# Patient Record
Sex: Female | Born: 1975 | Race: White | Hispanic: No | Marital: Married | Smoking: Never smoker
Health system: Southern US, Community
[De-identification: ages and names within clinical notes are randomized; demographics above are authoritative.]

## PROBLEM LIST (undated history)

## (undated) DIAGNOSIS — O09529 Supervision of elderly multigravida, unspecified trimester: Secondary | ICD-10-CM

## (undated) DIAGNOSIS — C801 Malignant (primary) neoplasm, unspecified: Secondary | ICD-10-CM

## (undated) HISTORY — PX: OTHER SURGICAL HISTORY: SHX169

## (undated) HISTORY — DX: Supervision of elderly multigravida, unspecified trimester: O09.529

## (undated) HISTORY — DX: Malignant (primary) neoplasm, unspecified: C80.1

---

## 2003-09-24 ENCOUNTER — Other Ambulatory Visit: Admission: RE | Admit: 2003-09-24 | Discharge: 2003-09-24 | Payer: Self-pay | Admitting: Obstetrics and Gynecology

## 2004-11-03 ENCOUNTER — Other Ambulatory Visit: Admission: RE | Admit: 2004-11-03 | Discharge: 2004-11-03 | Payer: Self-pay | Admitting: Obstetrics and Gynecology

## 2005-09-22 ENCOUNTER — Inpatient Hospital Stay (HOSPITAL_COMMUNITY): Admission: AD | Admit: 2005-09-22 | Discharge: 2005-09-26 | Payer: Self-pay | Admitting: Obstetrics and Gynecology

## 2005-10-03 ENCOUNTER — Inpatient Hospital Stay (HOSPITAL_COMMUNITY): Admission: AD | Admit: 2005-10-03 | Discharge: 2005-10-03 | Payer: Self-pay | Admitting: Obstetrics and Gynecology

## 2008-01-08 ENCOUNTER — Encounter (INDEPENDENT_AMBULATORY_CARE_PROVIDER_SITE_OTHER): Payer: Self-pay | Admitting: Obstetrics and Gynecology

## 2008-01-08 ENCOUNTER — Inpatient Hospital Stay (HOSPITAL_COMMUNITY): Admission: AD | Admit: 2008-01-08 | Discharge: 2008-01-10 | Payer: Self-pay | Admitting: Obstetrics and Gynecology

## 2010-11-24 NOTE — H&P (Signed)
Gloria Stark, Gloria NO.:  Stark   MEDICAL RECORD NO.:  [id number]          PATIENT TYPE:  INP   LOCATION:  9165                          FACILITY:  [facility]   PHYSICIAN:  Duke Salvia. Marcelle Overlie, M.D.DATE OF BIRTH:  1975/09/30   DATE OF ADMISSION:  01/08/2008  DATE OF DISCHARGE:                              HISTORY & PHYSICAL   CHIEF COMPLAINT:  Early labor.   HISTORY OF PRESENT ILLNESS:  A 35 year old G2, P1-0-0-1 at [redacted] weeks  gestation presents in early labor.  Her GBS is negative.  Actually she  had scheduled an RCA on July 13 but had initially preferred a trial of  labor.  The risk of VBAC related to bleeding, infection, transfusion,  and the possible need for cesarean, or emergency cesarean all reviewed  with her and she is committed to trial of labor.  Blood type is O+,  rubella titer is immune.   PAST MEDICAL HISTORY:  First cesarean in 2007 for a 6 pound 11 ounce  female for failure to progress.  Please see her [facility] form for  additional family history and social history.   PHYSICAL EXAM:  VITAL SIGNS:  BP was 140/92.  Urine showed 1+ protein.  Platelet count and LFTs were normal.  LUNGS:  Clear.  HEENT: Unremarkable.  NECK:  Supple without masses.  ABDOMINAL PAIN:  Term fundal height.  Fetal heart rate 140s.  PELVIC EXAM:  Cervix was 1 at 90%, membranes intact.  EXTREMITIES:  Showed 1+ to 2+ edema.  Reflexes 1+ to 2+, no clonus.   IMPRESSION:  1. Mild preeclampsia.  2. Previous cesarean section, desires trial of labor.  The risks were      reviewed as above.   PLAN:  Will admit for AROM possible low doses of Pitocin augmentation.      Richard M. Marcelle Overlie, M.D.  Electronically Signed     RMH/MEDQ  D:  01/08/2008  T:  01/08/2008  Job:  [id number]

## 2010-11-27 NOTE — Op Note (Signed)
NAMEALLI, Gloria                ACCOUNT NO.:  [id number]   MEDICAL RECORD NO.:  [id number]          PATIENT TYPE:  INP   LOCATION:  9168                          FACILITY:  [facility]   PHYSICIAN:  Dineen Kid. Rana Snare, M.D.    DATE OF BIRTH:  Mar 04, 1976   DATE OF PROCEDURE:  09/23/2005  DATE OF DISCHARGE:                                 OPERATIVE REPORT   PREOPERATIVE DIAGNOSES:  1.  Intrauterine pregnancy at 40+ weeks.  2.  Arrest of dilation.   POSTOPERATIVE DIAGNOSES:  1.  Intrauterine pregnancy at 40+ weeks.  2.  Arrest of dilation.  3.  Occiput posterior presentation.   OPERATION/PROCEDURE:  Primary low segment transverse cesarean section.   SURGEON:  Dineen Kid. Rana Snare, M.D.   ANESTHESIA:  Epidural.   INDICATIONS:  [name] is a 35 year old gravida 1 at 40+ weeks who  presented for induction of labor due to borderline pelvis and post dates.  She underwent Cervidil cervical ripening followed by Pitocin and amniotomy.  She progressed to 4 cm, completely effaced, -2 station, but despite adequate  contractions for greater than 4-1/2 hours, no further change in dilation was  made.  Because of arrest of dilation, planned to proceed with primary low  segment transverse cesarean section.  Risks and benefits were discussed and  informed consent was obtained.   FINDINGS:  At the time of surgery, a viable female infant, Apgars 8 and 9,  with a pH arterial 7.38, weight 6 pounds 11 ounces.  The baby was also in  the occiput posterior presentation.   DESCRIPTION OF PROCEDURE:  After adequate anesthesia, the patient was placed  in the supine position, left lateral tilt.  She was sterilely prepped and  draped.  Bladder was sterilely drained with a Foley catheter.  A  Pfannenstiel skin incision was made two fingerbreadths above the pubic  symphysis, taken down sharply to the fascia which was incised transversely  superiorly and inferiorly off the bellies of the rectus muscle which were  separated sharply in the midline.  Peritoneum was entered sharply, bladder  flap was created, placed behind the bladder blade.  A low segment myotomy  incision was made down to the infant's vertex, extended laterally with  operator's fingertips.  Amniotomy was performed and the infant's vertex was  delivered atraumatically through the incision.  The nares and pharynx  suctioned.  Infant was then delivered, cord was clamped and cut, and infant  was handed to the pediatricians for resuscitation.  Cord blood was obtained.  Placenta was extracted manually.  Uterus was exteriorized, wiped clean with  a dry lap. The myotomy incision was closed in two layers, the first layer  being a running locking layer, the second being the imbricating layer with 0  Monocryl suture.  Uterus was placed back into the peritoneal cavity, and  after a copious amount of irrigation, adequate hemostasis was assured.  The  peritoneum was closed with a 0 Monocryl.  Rectus muscle plicated in the  midline.  Irrigation was applied and after adequate hemostasis, the fascia  was then closed with a #  1 Vicryl.  Irrigation was applied and after adequate  hemostasis, skin was stapled. Steri-  Strips applied.  The patient tolerated the procedure well and was stable on  transfer to the recovery room.  Sponge and instrument count was normal x3.  The estimated blood loss was 800 mL.  The patient received 1 g of Rocephin  after delivery of the placenta.      Dineen Kid Rana Snare, M.D.  Electronically Signed     DCL/MEDQ  D:  09/23/2005  T:  09/25/2005  Job:  [id number]

## 2010-11-27 NOTE — Discharge Summary (Signed)
NAMEKAMBREA, CARRASCO NO.:  [id number]   MEDICAL RECORD NO.:  [id number]          PATIENT TYPE:  INP   LOCATION:  9132                          FACILITY:  [facility]   PHYSICIAN:  Duke Salvia. Marcelle Overlie, M.D.DATE OF BIRTH:  [birth year]   DATE OF ADMISSION:  09/22/2005  DATE OF DISCHARGE:  09/26/2005                                 DISCHARGE SUMMARY   ADMITTING DIAGNOSES:  1.  Intrauterine pregnancy at 40+ weeks estimated gestational age/  2.  Two-stage induction of labor due to borderline pelvis and post dates.   DISCHARGE DIAGNOSES:  1.  Status post low transverse cesarean section secondary to arrest of      dilatation  2.  Viable female infant.   PROCEDURE:  Primary low transverse cesarean section.   REASON FOR ADMISSION:  Please see written H&P.   HOSPITAL COURSE:  The patient is 35 year old married female, primigravida,  who was admitted to [facility] at 40+ weeks for two-stage  induction of labor.  The patient underwent cervical ripening followed by  Pitocin and amniotomy.  The patient did progress to 4 cm dilated, completely  effaced, vertex at -2 station.  Despite adequate contractions for greater  than four and a half hours, no further cervical change was made.  Due to  arrest of dilatation, decision was made to proceed with a primary low  transverse cesarean section.  The patient was then transferred to the  operating room where epidural was dosed to an adequate surgical level.  A  low transverse incision was made with delivery of a viable female infant  weighing 6 pounds 11 ounces, with Apgars of 8 at one minute and 9 at five  minutes.  Arterial cord pH was 7.38.  The patient tolerated the procedure  well and was taken to recovery room in stable condition.  On postoperative  day #1, the patient was without complaint.  Vital signs were stable.  Abdomen soft with decreased bowel sounds.  Fundus was firm and nontender.  Abdominal dressing was  noted to be clean, dry and intact.  Laboratory  findings revealed hemoglobin of 7.7. The patient was started on iron  supplementation, and CBC was ordered for the following morning. On  postoperative day #2, the patient was without complaint.  Vital signs  remained stable.  She was afebrile.  Fundus firm and nontender.  Abdominal  dressing had been removed revealing an incision that was clean, dry and  intact.  The patient had been started on Rocephin postoperatively and that  was discontinued, and the patient was now started on Augmentin orally. On  postoperative day #3, the patient continued to be afebrile.  Vital signs  were stable.  She is ambulating well, tolerating a regular diet without  complaints of nausea and vomiting.  Abdomen is soft with good return of  bowel function.  Fundus firm, nontender.  Incision was clean, dry and  intact.  Staples were removed, and the patient was discharged home.   CONDITION ON DISCHARGE:  Good.   DIET:  Regular as tolerated.   ACTIVITY:  No  heavy lifting, no driving x2 weeks, no vaginal entry.   FOLLOW UP:  Patient to follow up in the office in one week for an incision  check.  She is to call for a temperature greater than 100 degrees,  persistent nausea and vomiting, heavy vaginal bleeding and/or redness or  drainage from the incisional site.   DISCHARGE MEDICATIONS:  1.  Tylox #31 p.o. every 4 to 6 hours p.r.n.  2.  Augmentin 875 mg 1 p.o. b.i.d. for 7 days.  3.  Tandem iron supplementation 1 p.o. daily.  4.  Prenatal vitamins 1 p.o. daily.      Julio Sicks, N.P.      Richard M. Marcelle Overlie, M.D.  Electronically Signed    CC/MEDQ  D:  10/20/2005  T:  10/20/2005  Job:  [id number]

## 2011-04-08 LAB — URINALYSIS, ROUTINE W REFLEX MICROSCOPIC
Bilirubin Urine: NEGATIVE
Leukocytes, UA: NEGATIVE
Nitrite: NEGATIVE
Specific Gravity, Urine: >1.03 — ABNORMAL HIGH
Urobilinogen, UA: 0.2
pH: 6.5

## 2011-04-08 LAB — COMPREHENSIVE METABOLIC PANEL
AST: 25
Albumin: 3 — ABNORMAL LOW
BUN: 9
CO2: 22
Calcium: 9
Creatinine, Ser: 0.55
GFR calc Af Amer: >60
GFR calc non Af Amer: >60

## 2011-04-08 LAB — URINE MICROSCOPIC-ADD ON

## 2011-04-08 LAB — CBC
Hemoglobin: 12.3
MCHC: 34.7
MCHC: 34.8
MCV: 95.1
Platelets: 177
Platelets: 197
RDW: 13.3

## 2011-04-08 LAB — URIC ACID: Uric Acid, Serum: 4.3

## 2012-12-08 LAB — OB RESULTS CONSOLE ANTIBODY SCREEN: Antibody Screen: NEGATIVE

## 2012-12-08 LAB — OB RESULTS CONSOLE ABO/RH: RH TYPE: POSITIVE

## 2012-12-08 LAB — OB RESULTS CONSOLE RPR: RPR: NONREACTIVE

## 2012-12-08 LAB — OB RESULTS CONSOLE RUBELLA ANTIBODY, IGM: RUBELLA: IMMUNE

## 2012-12-08 LAB — OB RESULTS CONSOLE GC/CHLAMYDIA
Chlamydia: NEGATIVE
Gonorrhea: NEGATIVE

## 2012-12-08 LAB — OB RESULTS CONSOLE HEPATITIS B SURFACE ANTIGEN: HEP B S AG: NEGATIVE

## 2012-12-08 LAB — OB RESULTS CONSOLE HIV ANTIBODY (ROUTINE TESTING): HIV: NONREACTIVE

## 2012-12-26 ENCOUNTER — Inpatient Hospital Stay (HOSPITAL_COMMUNITY): Admission: AD | Admit: 2012-12-26 | Payer: Self-pay | Source: Ambulatory Visit | Admitting: Obstetrics and Gynecology

## 2013-07-20 ENCOUNTER — Telehealth (HOSPITAL_COMMUNITY): Payer: Self-pay | Admitting: *Deleted

## 2013-07-20 ENCOUNTER — Encounter (HOSPITAL_COMMUNITY): Payer: Self-pay | Admitting: *Deleted

## 2013-07-20 LAB — OB RESULTS CONSOLE GBS: GBS: NEGATIVE

## 2013-07-20 NOTE — Telephone Encounter (Signed)
Preadmission screen  

## 2013-07-21 ENCOUNTER — Encounter (HOSPITAL_COMMUNITY): Payer: Managed Care, Other (non HMO) | Admitting: Anesthesiology

## 2013-07-21 ENCOUNTER — Inpatient Hospital Stay (HOSPITAL_COMMUNITY): Payer: Managed Care, Other (non HMO) | Admitting: Anesthesiology

## 2013-07-21 ENCOUNTER — Inpatient Hospital Stay (HOSPITAL_COMMUNITY)
Admission: AD | Admit: 2013-07-21 | Discharge: 2013-07-22 | DRG: 775 | Disposition: A | Discharge status: Home / Self Care | Payer: Managed Care, Other (non HMO) | Source: Ambulatory Visit | Attending: Obstetrics and Gynecology | Admitting: Obstetrics and Gynecology

## 2013-07-21 ENCOUNTER — Encounter (HOSPITAL_COMMUNITY): Payer: Self-pay

## 2013-07-21 DIAGNOSIS — O34219 Maternal care for unspecified type scar from previous cesarean delivery: Secondary | ICD-10-CM | POA: Diagnosis present

## 2013-07-21 DIAGNOSIS — O09529 Supervision of elderly multigravida, unspecified trimester: Secondary | ICD-10-CM | POA: Diagnosis present

## 2013-07-21 DIAGNOSIS — Z3490 Encounter for supervision of normal pregnancy, unspecified, unspecified trimester: Secondary | ICD-10-CM

## 2013-07-21 LAB — CBC
HCT: 42.6 % (ref 36.0–46.0)
Hemoglobin: 15 g/dL (ref 12.0–15.0)
MCH: 31.6 pg (ref 26.0–34.0)
MCHC: 35.2 g/dL (ref 30.0–36.0)
MCV: 89.9 fL (ref 78.0–100.0)
PLATELETS: 227 10*3/uL (ref 150–400)
RBC: 4.74 MIL/uL (ref 3.87–5.11)
RDW: 13.2 % (ref 11.5–15.5)
WBC: 20.9 10*3/uL — ABNORMAL HIGH (ref 4.0–10.5)

## 2013-07-21 LAB — TYPE AND SCREEN
ABO/RH(D): O POS
ANTIBODY SCREEN: NEGATIVE

## 2013-07-21 LAB — RPR: RPR Ser Ql: NONREACTIVE

## 2013-07-21 LAB — ABO/RH: ABO/RH(D): O POS

## 2013-07-21 MED ORDER — IBUPROFEN 600 MG PO TABS
600.0000 mg | ORAL_TABLET | Freq: Four times a day (QID) | ORAL | Status: DC | PRN
  Filled 2013-07-21: qty 1

## 2013-07-21 MED ORDER — OXYTOCIN 40 UNITS IN LACTATED RINGERS INFUSION - SIMPLE MED
62.5000 mL/h | INTRAVENOUS | Status: DC
  Filled 2013-07-21: qty 1000

## 2013-07-21 MED ORDER — PHENYLEPHRINE 40 MCG/ML (10ML) SYRINGE FOR IV PUSH (FOR BLOOD PRESSURE SUPPORT)
PREFILLED_SYRINGE | INTRAVENOUS | Status: AC
  Filled 2013-07-21: qty 10

## 2013-07-21 MED ORDER — CITRIC ACID-SODIUM CITRATE 334-500 MG/5ML PO SOLN: 30.0000 mL | ORAL | Status: DC | PRN

## 2013-07-21 MED ORDER — ACETAMINOPHEN 325 MG PO TABS: 650.0000 mg | ORAL_TABLET | ORAL | Status: DC | PRN

## 2013-07-21 MED ORDER — IBUPROFEN 600 MG PO TABS
600.0000 mg | ORAL_TABLET | Freq: Four times a day (QID) | ORAL | Status: DC | PRN
  Administered 2013-07-21: 600 mg via ORAL

## 2013-07-21 MED ORDER — EPHEDRINE 5 MG/ML INJ
INTRAVENOUS | Status: AC
  Filled 2013-07-21: qty 4

## 2013-07-21 MED ORDER — LANOLIN HYDROUS EX OINT
TOPICAL_OINTMENT | CUTANEOUS | Status: DC | PRN
Start: 2013-07-21 — End: 2013-07-22

## 2013-07-21 MED ORDER — OXYTOCIN 40 UNITS IN LACTATED RINGERS INFUSION - SIMPLE MED
62.5000 mL/h | INTRAVENOUS | Status: DC
  Administered 2013-07-21: 62.5 mL/h via INTRAVENOUS

## 2013-07-21 MED ORDER — ONDANSETRON HCL 4 MG/2ML IJ SOLN: 4.0000 mg | Freq: Four times a day (QID) | INTRAMUSCULAR | Status: DC | PRN

## 2013-07-21 MED ORDER — TETANUS-DIPHTH-ACELL PERTUSSIS 5-2.5-18.5 LF-MCG/0.5 IM SUSP: 0.5000 mL | Freq: Once | INTRAMUSCULAR | Status: DC

## 2013-07-21 MED ORDER — OXYTOCIN BOLUS FROM INFUSION: 500.0000 mL | INTRAVENOUS | Status: DC

## 2013-07-21 MED ORDER — DIPHENHYDRAMINE HCL 25 MG PO CAPS
25.0000 mg | ORAL_CAPSULE | Freq: Four times a day (QID) | ORAL | Status: DC | PRN
Start: 2013-07-21 — End: 2013-07-22

## 2013-07-21 MED ORDER — DIBUCAINE 1 % RE OINT: 1.0000 "application " | TOPICAL_OINTMENT | RECTAL | Status: DC | PRN

## 2013-07-21 MED ORDER — ONDANSETRON HCL 4 MG PO TABS: 4.0000 mg | ORAL_TABLET | ORAL | Status: DC | PRN

## 2013-07-21 MED ORDER — SIMETHICONE 80 MG PO CHEW: 80.0000 mg | CHEWABLE_TABLET | ORAL | Status: DC | PRN

## 2013-07-21 MED ORDER — LIDOCAINE HCL (PF) 1 % IJ SOLN
30.0000 mL | INTRAMUSCULAR | Status: DC | PRN
  Filled 2013-07-21 (×2): qty 30

## 2013-07-21 MED ORDER — WITCH HAZEL-GLYCERIN EX PADS: 1.0000 "application " | MEDICATED_PAD | CUTANEOUS | Status: DC | PRN

## 2013-07-21 MED ORDER — LIDOCAINE HCL (PF) 1 % IJ SOLN
30.0000 mL | INTRAMUSCULAR | Status: DC | PRN
  Filled 2013-07-21: qty 30

## 2013-07-21 MED ORDER — OXYCODONE-ACETAMINOPHEN 5-325 MG PO TABS: 1.0000 | ORAL_TABLET | ORAL | Status: DC | PRN

## 2013-07-21 MED ORDER — BENZOCAINE-MENTHOL 20-0.5 % EX AERO
1.0000 "application " | INHALATION_SPRAY | CUTANEOUS | Status: DC | PRN
  Administered 2013-07-22: 1 via TOPICAL
  Filled 2013-07-21: qty 56

## 2013-07-21 MED ORDER — IBUPROFEN 600 MG PO TABS
600.0000 mg | ORAL_TABLET | Freq: Four times a day (QID) | ORAL | Status: DC
  Administered 2013-07-21 – 2013-07-22 (×4): 600 mg via ORAL
  Filled 2013-07-21 (×4): qty 1

## 2013-07-21 MED ORDER — LACTATED RINGERS IV SOLN: INTRAVENOUS | Status: DC

## 2013-07-21 MED ORDER — LACTATED RINGERS IV SOLN: 500.0000 mL | INTRAVENOUS | Status: DC | PRN

## 2013-07-21 MED ORDER — LACTATED RINGERS IV SOLN
INTRAVENOUS | Status: DC
  Administered 2013-07-21: 10:00:00 via INTRAVENOUS

## 2013-07-21 MED ORDER — MEDROXYPROGESTERONE ACETATE 150 MG/ML IM SUSP: 150.0000 mg | INTRAMUSCULAR | Status: DC | PRN

## 2013-07-21 MED ORDER — SENNOSIDES-DOCUSATE SODIUM 8.6-50 MG PO TABS
2.0000 | ORAL_TABLET | ORAL | Status: DC
  Administered 2013-07-22: 2 via ORAL
  Filled 2013-07-21: qty 2

## 2013-07-21 MED ORDER — FLEET ENEMA 7-19 GM/118ML RE ENEM: 1.0000 | ENEMA | RECTAL | Status: DC | PRN

## 2013-07-21 MED ORDER — ONDANSETRON HCL 4 MG/2ML IJ SOLN: 4.0000 mg | INTRAMUSCULAR | Status: DC | PRN

## 2013-07-21 MED ORDER — LIDOCAINE HCL (PF) 1 % IJ SOLN
INTRAMUSCULAR | Status: DC | PRN
  Administered 2013-07-21 (×2): 5 mL

## 2013-07-21 MED ORDER — FENTANYL 2.5 MCG/ML BUPIVACAINE 1/10 % EPIDURAL INFUSION (WH - ANES)
INTRAMUSCULAR | Status: AC
  Administered 2013-07-21: 14 mL/h via EPIDURAL
  Filled 2013-07-21: qty 125

## 2013-07-21 MED ORDER — PRENATAL MULTIVITAMIN CH
1.0000 | ORAL_TABLET | Freq: Every day | ORAL | Status: DC
  Administered 2013-07-22: 1 via ORAL
  Filled 2013-07-21: qty 1

## 2013-07-21 MED ORDER — MEASLES, MUMPS & RUBELLA VAC ~~LOC~~ INJ
0.5000 mL | INJECTION | Freq: Once | SUBCUTANEOUS | Status: DC
  Filled 2013-07-21: qty 0.5

## 2013-07-21 NOTE — Progress Notes (Signed)
Notified of pt arrival and status in MAU. Will watch for 1 hour and recheck

## 2013-07-21 NOTE — Progress Notes (Signed)
VBAC of vigerous female infant w/ apgars of 8,9.  Mild shoulder dystocia (<56min) easily relieved with McRoberts and Suprapubic pressure No excess traction applied, right shoulder anterior. Placenta delivered spontaneous w/ 3VC.   2nd degree lac repaired w/ 3-0 vicryl rapide.  Fundus firm.  EBL 350cc.  Infant freely moving both arms after delivery Couplet care with mom

## 2013-07-21 NOTE — Anesthesia Preprocedure Evaluation (Signed)

## 2013-07-21 NOTE — MAU Note (Signed)
Pt states here for contractions q5 minutes apart. Denies lof. Was 3cm in office last week.

## 2013-07-21 NOTE — Anesthesia Postprocedure Evaluation (Signed)
Anesthesia Post Note  Patient: Gloria Stark  Procedure(s) Performed: * No procedures listed *  Anesthesia type: Epidural  Patient location: Mother/Baby  Post pain: Pain level controlled  Post assessment: Post-op Vital signs reviewed  Last Vitals:  Filed Vitals:   07/21/13 1405  BP: 123/75  Pulse: 100  Temp: 37 C  Resp: 18    Post vital signs: Reviewed  Level of consciousness:alert  Complications: No apparent anesthesia complications

## 2013-07-21 NOTE — Anesthesia Procedure Notes (Signed)
Epidural Patient location during procedure: OB Start time: 07/21/2013 10:21 AM  Staffing Anesthesiologist: Rudean Curt Performed by: anesthesiologist   Preanesthetic Checklist Completed: patient identified, site marked, surgical consent, pre-op evaluation, timeout performed, IV checked, risks and benefits discussed and monitors and equipment checked  Epidural Patient position: sitting Prep: site prepped and draped and DuraPrep Patient monitoring: continuous pulse ox and blood pressure Approach: midline Injection technique: LOR air  Needle:  Needle type: Tuohy  Needle gauge: 17 G Needle length: 9 cm and 9 Needle insertion depth: 5 cm cm Catheter type: closed end flexible Catheter size: 19 Gauge Catheter at skin depth: 10 cm Test dose: negative  Assessment Events: blood not aspirated, injection not painful, no injection resistance, negative IV test and no paresthesia  Additional Notes Patient identified.  Risk benefits discussed including failed block, incomplete pain control, headache, nerve damage, paralysis, blood pressure changes, nausea, vomiting, reactions to medication both toxic or allergic, and postpartum back pain.  Patient expressed understanding and wished to proceed.  All questions were answered.  Sterile technique used throughout procedure and epidural site dressed with sterile barrier dressing. No paresthesia or other complications noted.The patient did not experience any signs of intravascular injection such as tinnitus or metallic taste in mouth nor signs of intrathecal spread such as rapid motor block. Please see nursing notes for vital signs.

## 2013-07-21 NOTE — H&P (Signed)
Wilena Tyndall is a 38 y.o. female presenting for labor.  Pt reports contractions that started last night and have increased in intensity this morning.  No vb or lof.  Pregnancy uncomplicated.  Pt w/ h/o c-section and subsequent VBAC.  Desires TOLAC.    History OB History   Grav Para Term Preterm Abortions TAB SAB Ect Mult Living   3 2 2       2      Past Medical History  Diagnosis Date  . Cancer     basal cell Forehead  . AMA (advanced maternal age) multigravida 35+    Past Surgical History  Procedure Laterality Date  . Skin cancer removal    . Cesarean section     Family History: family history includes Birth defects in her daughter; Cancer in her father, paternal grandfather, and paternal grandmother; Depression in her father; Diabetes in her maternal grandmother and paternal grandmother; Down syndrome in her cousin; Heart disease in her father and maternal grandfather; Stroke in her father. Social History:  reports that she has never smoked. She has never used smokeless tobacco. She reports that she does not drink alcohol or use illicit drugs.   Prenatal Transfer Tool  Maternal Diabetes: No Genetic Screening: Normal Maternal Ultrasounds/Referrals: Normal Fetal Ultrasounds or other Referrals:  None Maternal Substance Abuse:  No Significant Maternal Medications:  None Significant Maternal Lab Results:  None Other Comments:  None  ROS  Dilation: 7 Effacement (%): 100 Station:  (bbow) Exam by:: Ginger Morris RN Blood pressure 120/66, pulse 107, height 5' (1.524 m), weight 61.406 kg (135 lb 6 oz), last menstrual period 10/09/2012, SpO2 100.00%. Exam Physical Exam  Gen - Uncomfortable w/ epidural Abd - gravid, NT Ext - NT, no edema Cvx 9/C/0, vtx AROM - light meconium  Prenatal labs: ABO, Rh: O/Positive/-- (05/30 0000) Antibody: Negative (05/30 0000) Rubella: Immune (05/30 0000) RPR: Nonreactive (05/30 0000)  HBsAg: Negative (05/30 0000)  HIV: Non-reactive (05/30  0000)  GBS: Negative (01/09 0000)   Assessment/Plan: Admit Epidural prn TOLAC  Keith Cancio 07/21/2013, 10:55 AM

## 2013-07-21 NOTE — Progress Notes (Signed)
Notified of pt cervical change. Received orders to admit to birthing suites

## 2013-07-22 LAB — CBC
HEMATOCRIT: 32.9 % — AB (ref 36.0–46.0)
Hemoglobin: 11.2 g/dL — ABNORMAL LOW (ref 12.0–15.0)
MCH: 30.6 pg (ref 26.0–34.0)
MCHC: 34 g/dL (ref 30.0–36.0)
MCV: 89.9 fL (ref 78.0–100.0)
Platelets: 241 10*3/uL (ref 150–400)
RBC: 3.66 MIL/uL — ABNORMAL LOW (ref 3.87–5.11)
RDW: 13.4 % (ref 11.5–15.5)
WBC: 20.4 10*3/uL — ABNORMAL HIGH (ref 4.0–10.5)

## 2013-07-22 MED ORDER — IBUPROFEN 600 MG PO TABS: 600.0000 mg | ORAL_TABLET | Freq: Four times a day (QID) | ORAL | Status: AC

## 2013-07-22 NOTE — Discharge Summary (Signed)
Obstetric Discharge Summary Reason for Admission: onset of labor Prenatal Procedures: ultrasound Intrapartum Procedures: spontaneous vaginal delivery, midline episiotomy Postpartum Procedures: none Complications-Operative and Postpartum: 2nd degree perineal laceration Hemoglobin  Date Value Range Status  07/22/2013 11.2* 12.0 - 15.0 g/dL Final     DELTA CHECK NOTED     REPEATED TO VERIFY     HCT  Date Value Range Status  07/22/2013 32.9* 36.0 - 46.0 % Final    Physical Exam:  General: alert and cooperative Lochia: appropriate Uterine Fundus: firm Incision: n/a DVT Evaluation: No evidence of DVT seen on physical exam.  Discharge Diagnoses: Term Pregnancy-delivered  Discharge Information: Date: 07/22/2013 Activity: pelvic rest Diet: routine Medications: PNV and Ibuprofen Condition: stable Instructions: refer to practice specific booklet Discharge to: home Follow-up Information   Schedule an appointment as soon as possible for a visit in 6 weeks to follow up.      Newborn Data: Live born female  Birth Weight: 8 lb 2 oz (3685 g) APGAR: 8, 9  Home with mother.  Gloria Stark 07/22/2013, 10:12 AM

## 2013-07-25 ENCOUNTER — Inpatient Hospital Stay (HOSPITAL_COMMUNITY): Admission: RE | Admit: 2013-07-25 | Payer: Managed Care, Other (non HMO) | Source: Ambulatory Visit

## 2014-05-13 ENCOUNTER — Encounter (HOSPITAL_COMMUNITY): Payer: Self-pay

## 2014-09-09 ENCOUNTER — Other Ambulatory Visit: Payer: Self-pay | Admitting: Obstetrics and Gynecology

## 2014-09-10 LAB — CYTOLOGY - PAP

## 2018-01-02 ENCOUNTER — Other Ambulatory Visit: Payer: Self-pay | Admitting: Obstetrics and Gynecology

## 2018-01-02 DIAGNOSIS — R928 Other abnormal and inconclusive findings on diagnostic imaging of breast: Secondary | ICD-10-CM

## 2018-01-10 ENCOUNTER — Other Ambulatory Visit: Payer: Self-pay | Admitting: Obstetrics and Gynecology

## 2018-01-10 ENCOUNTER — Ambulatory Visit
Admission: RE | Admit: 2018-01-10 | Discharge: 2018-01-10 | Disposition: A | Discharge status: Home / Self Care | Payer: BLUE CROSS/BLUE SHIELD | Source: Ambulatory Visit | Attending: Obstetrics and Gynecology | Admitting: Obstetrics and Gynecology

## 2018-01-10 DIAGNOSIS — R928 Other abnormal and inconclusive findings on diagnostic imaging of breast: Secondary | ICD-10-CM

## 2018-01-10 DIAGNOSIS — N6310 Unspecified lump in the right breast, unspecified quadrant: Secondary | ICD-10-CM

## 2018-01-17 ENCOUNTER — Other Ambulatory Visit: Payer: Managed Care, Other (non HMO)

## 2018-07-14 ENCOUNTER — Other Ambulatory Visit: Payer: BLUE CROSS/BLUE SHIELD

## 2018-09-01 ENCOUNTER — Other Ambulatory Visit: Payer: BLUE CROSS/BLUE SHIELD

## 2018-09-22 ENCOUNTER — Other Ambulatory Visit: Payer: BLUE CROSS/BLUE SHIELD

## 2019-01-22 ENCOUNTER — Other Ambulatory Visit: Payer: Self-pay | Admitting: Obstetrics and Gynecology

## 2019-01-22 DIAGNOSIS — N6310 Unspecified lump in the right breast, unspecified quadrant: Secondary | ICD-10-CM

## 2019-02-06 ENCOUNTER — Ambulatory Visit
Admission: RE | Admit: 2019-02-06 | Discharge: 2019-02-06 | Disposition: A | Discharge status: Home / Self Care | Payer: BLUE CROSS/BLUE SHIELD | Source: Ambulatory Visit | Attending: Obstetrics and Gynecology | Admitting: Obstetrics and Gynecology

## 2019-02-06 ENCOUNTER — Ambulatory Visit
Admission: RE | Admit: 2019-02-06 | Discharge: 2019-02-06 | Disposition: A | Discharge status: Home / Self Care | Payer: 59 | Source: Ambulatory Visit | Attending: Obstetrics and Gynecology | Admitting: Obstetrics and Gynecology

## 2019-02-06 ENCOUNTER — Other Ambulatory Visit: Payer: Self-pay

## 2019-02-06 ENCOUNTER — Other Ambulatory Visit: Payer: Self-pay | Admitting: Obstetrics and Gynecology

## 2019-02-06 DIAGNOSIS — N6310 Unspecified lump in the right breast, unspecified quadrant: Secondary | ICD-10-CM

## 2019-08-15 ENCOUNTER — Other Ambulatory Visit: Payer: Self-pay

## 2019-08-15 ENCOUNTER — Ambulatory Visit
Admission: RE | Admit: 2019-08-15 | Discharge: 2019-08-15 | Disposition: A | Discharge status: Home / Self Care | Payer: 59 | Source: Ambulatory Visit | Attending: Obstetrics and Gynecology | Admitting: Obstetrics and Gynecology

## 2019-08-15 DIAGNOSIS — N6310 Unspecified lump in the right breast, unspecified quadrant: Secondary | ICD-10-CM

## 2019-11-16 ENCOUNTER — Other Ambulatory Visit: Payer: Self-pay | Admitting: Obstetrics and Gynecology

## 2019-11-16 DIAGNOSIS — N6310 Unspecified lump in the right breast, unspecified quadrant: Secondary | ICD-10-CM

## 2020-02-11 ENCOUNTER — Other Ambulatory Visit: Payer: Self-pay

## 2020-02-11 ENCOUNTER — Ambulatory Visit
Admission: RE | Admit: 2020-02-11 | Discharge: 2020-02-11 | Disposition: A | Discharge status: Home / Self Care | Payer: 59 | Source: Ambulatory Visit | Attending: Obstetrics and Gynecology | Admitting: Obstetrics and Gynecology

## 2020-02-11 DIAGNOSIS — N6310 Unspecified lump in the right breast, unspecified quadrant: Secondary | ICD-10-CM

## 2021-01-29 ENCOUNTER — Other Ambulatory Visit: Payer: Self-pay | Admitting: Obstetrics and Gynecology

## 2021-01-29 DIAGNOSIS — Z09 Encounter for follow-up examination after completed treatment for conditions other than malignant neoplasm: Secondary | ICD-10-CM

## 2021-02-02 ENCOUNTER — Other Ambulatory Visit: Payer: Self-pay | Admitting: Obstetrics and Gynecology

## 2021-02-02 DIAGNOSIS — N63 Unspecified lump in unspecified breast: Secondary | ICD-10-CM

## 2021-03-10 ENCOUNTER — Ambulatory Visit
Admission: RE | Admit: 2021-03-10 | Discharge: 2021-03-10 | Disposition: A | Discharge status: Home / Self Care | Payer: 59 | Source: Ambulatory Visit | Attending: Obstetrics and Gynecology | Admitting: Obstetrics and Gynecology

## 2021-03-10 ENCOUNTER — Other Ambulatory Visit: Payer: Self-pay

## 2021-03-10 DIAGNOSIS — N63 Unspecified lump in unspecified breast: Secondary | ICD-10-CM

## 2021-03-10 DIAGNOSIS — Z09 Encounter for follow-up examination after completed treatment for conditions other than malignant neoplasm: Secondary | ICD-10-CM

## 2023-01-11 IMAGING — MG DIGITAL DIAGNOSTIC BILAT W/ TOMO W/ CAD
6 of 12 series · 6 of 36 positions shown · non-contrast
Comparison: Previous exam(s).

CLINICAL DATA: Follow-up for probably benign mass in the outer
RIGHT breast. This probably benign mass was originally identified in
Thursday January, 2019.

EXAM:
DIGITAL DIAGNOSTIC BILATERAL MAMMOGRAM WITH TOMOSYNTHESIS AND CAD;
ULTRASOUND RIGHT BREAST LIMITED
TECHNIQUE: Bilateral digital diagnostic mammography and breast tomosynthesis
was performed. The images were evaluated with computer-aided
detection.; Targeted ultrasound examination of the right breast was
performed

[L MLO synth-2D (1 of 2)]
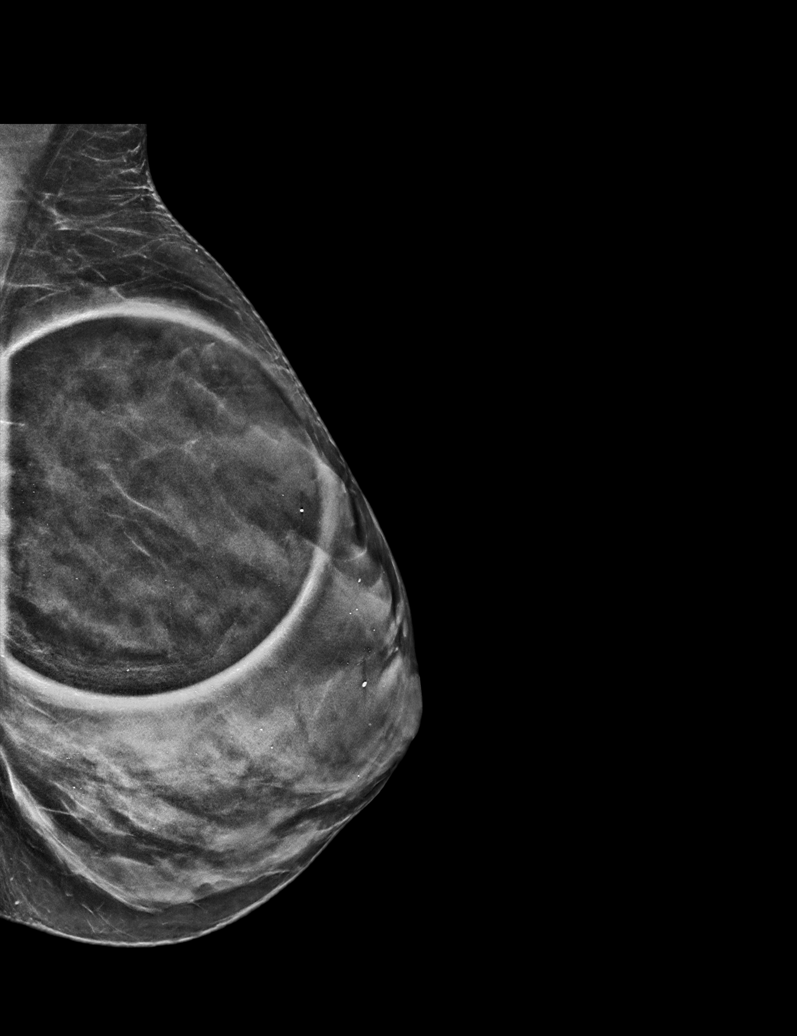

[R CC synth-2D]
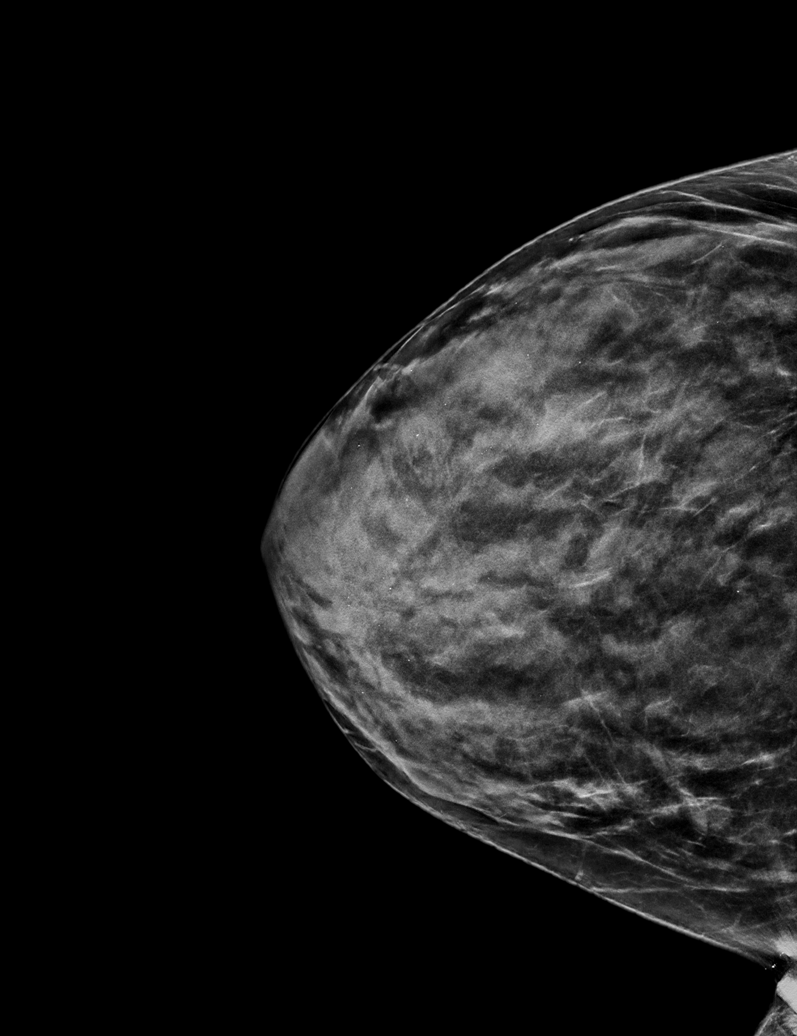

[L MLO synth-2D (2 of 2)]
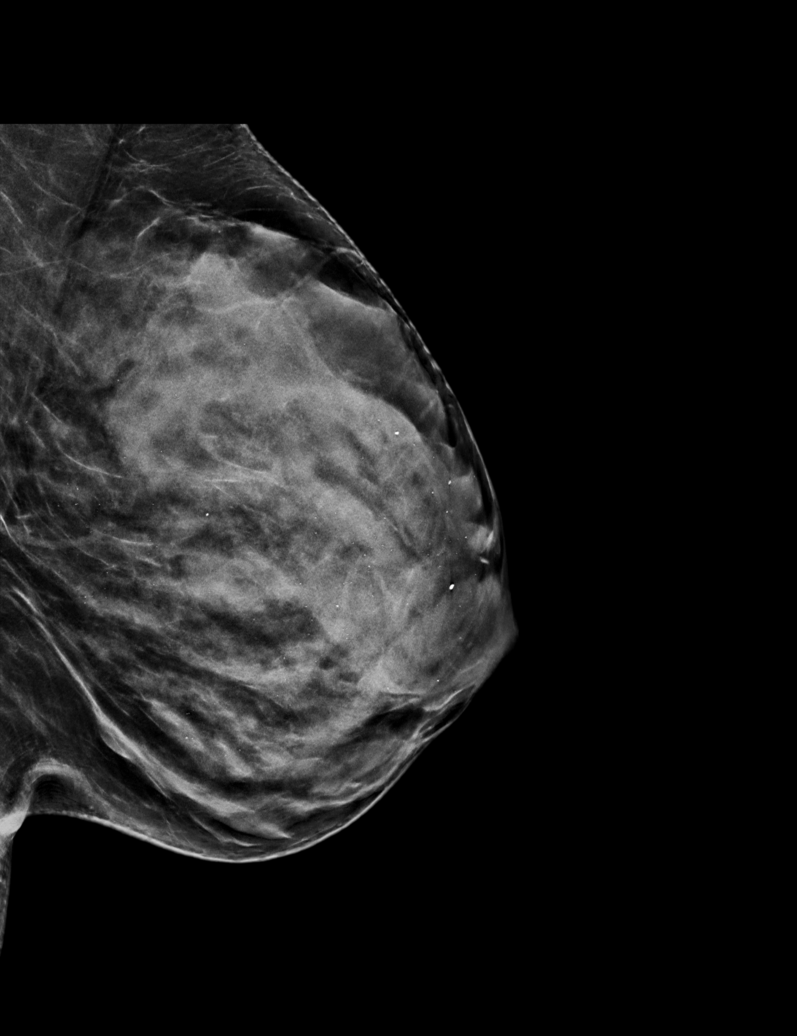

[L ML synth-2D]
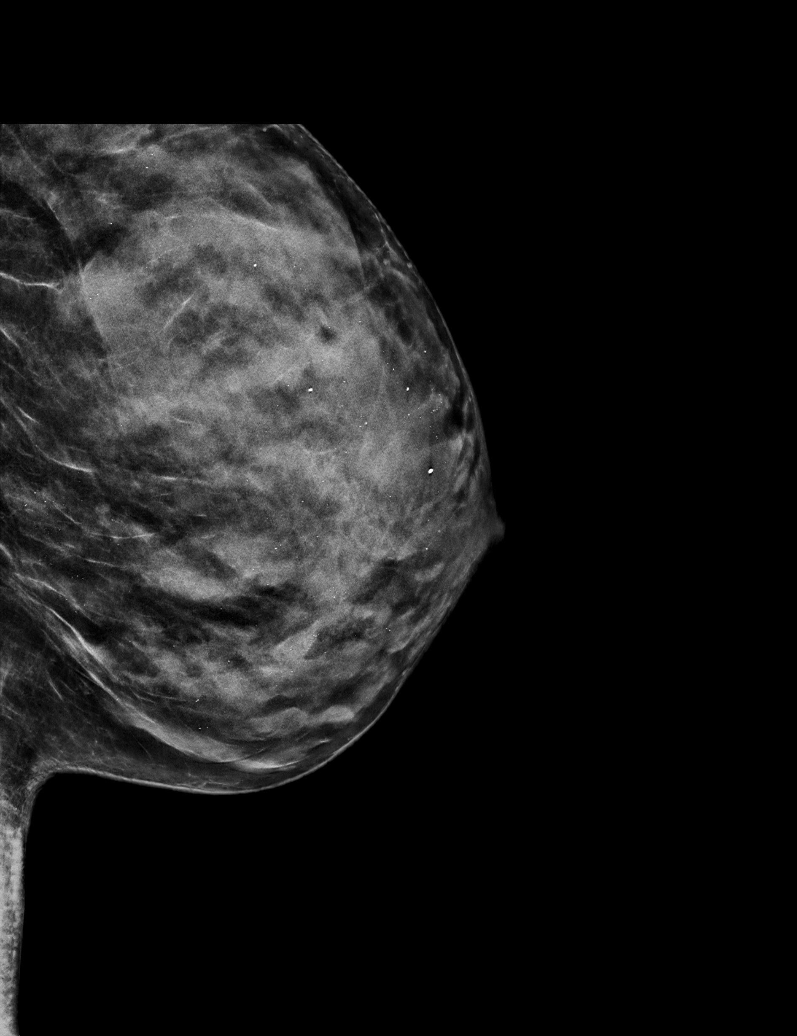

[L CC synth-2D]
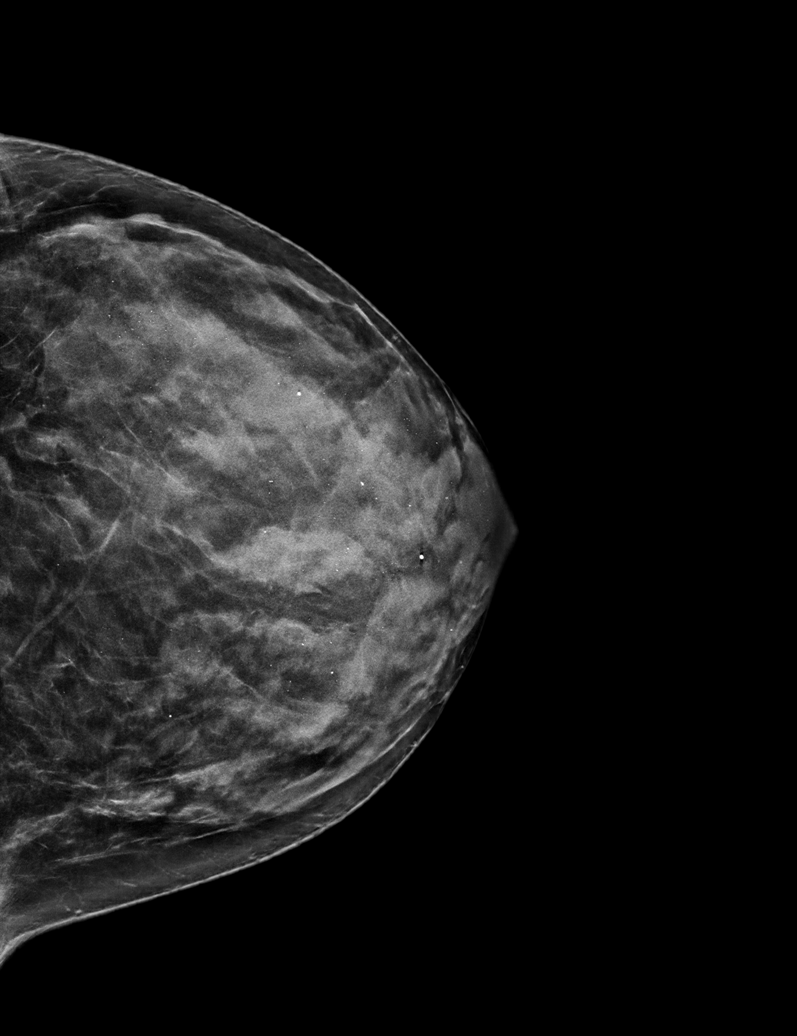

[R MLO synth-2D]
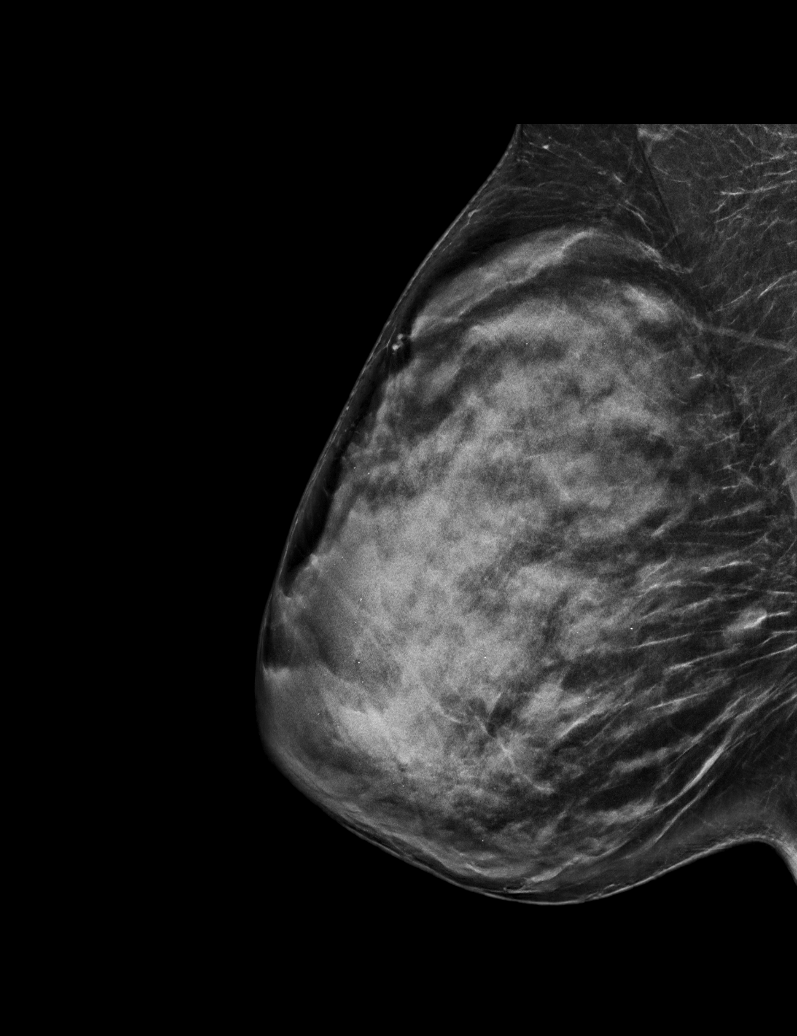

[6 of 36 positions shown; findings below may reference images not displayed]

ACR Breast Density Category d: The breast tissue is extremely dense,
which lowers the sensitivity of mammography.
FINDINGS: There are no new dominant masses, suspicious calcifications or
secondary signs of malignancy within either breast.

Targeted ultrasound is performed, again showing an oval
circumscribed hypoechoic mass in the RIGHT breast at the 9:30
o'clock axis, 5 cm from the nipple, measuring 1.2 cm, stable for 2
years confirming benignity.
IMPRESSION: 1. Stable benign mass within the RIGHT breast at the 9:30 o'clock
axis, measuring 1.2 cm, consistent with benign fibroadenoma. No
additional follow-up diagnostic imaging is necessary for this benign
finding.
2. No evidence of malignancy within either breast.

Patient may return to routine annual bilateral screening mammogram
schedule.

RECOMMENDATION:
Screening mammogram in one year.(Code:6R-6-5YX)

I have discussed the findings and recommendations with the patient.
If applicable, a reminder letter will be sent to the patient
regarding the next appointment.

BI-RADS CATEGORY  2: Benign.

## 2023-01-11 IMAGING — US US BREAST*R* LIMITED INC AXILLA
1 series · 4 of 4 positions shown · non-contrast
Comparison: Previous exam(s).

CLINICAL DATA: Follow-up for probably benign mass in the outer
RIGHT breast. This probably benign mass was originally identified in
Thursday January, 2019.

EXAM:
DIGITAL DIAGNOSTIC BILATERAL MAMMOGRAM WITH TOMOSYNTHESIS AND CAD;
ULTRASOUND RIGHT BREAST LIMITED
TECHNIQUE: Bilateral digital diagnostic mammography and breast tomosynthesis
was performed. The images were evaluated with computer-aided
detection.; Targeted ultrasound examination of the right breast was
performed

[Series 1: us breast*right* limited inc axilla · 0.05mm/px · 4 of 4 slices shown]
[im 1/4]
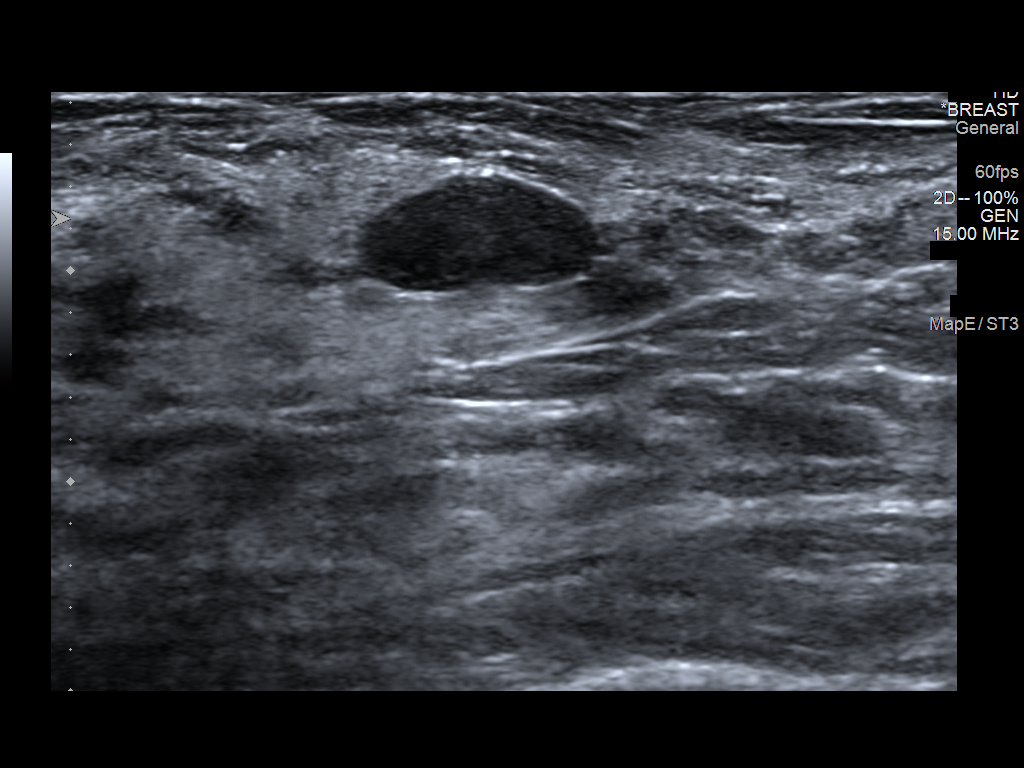
[im 2/4]
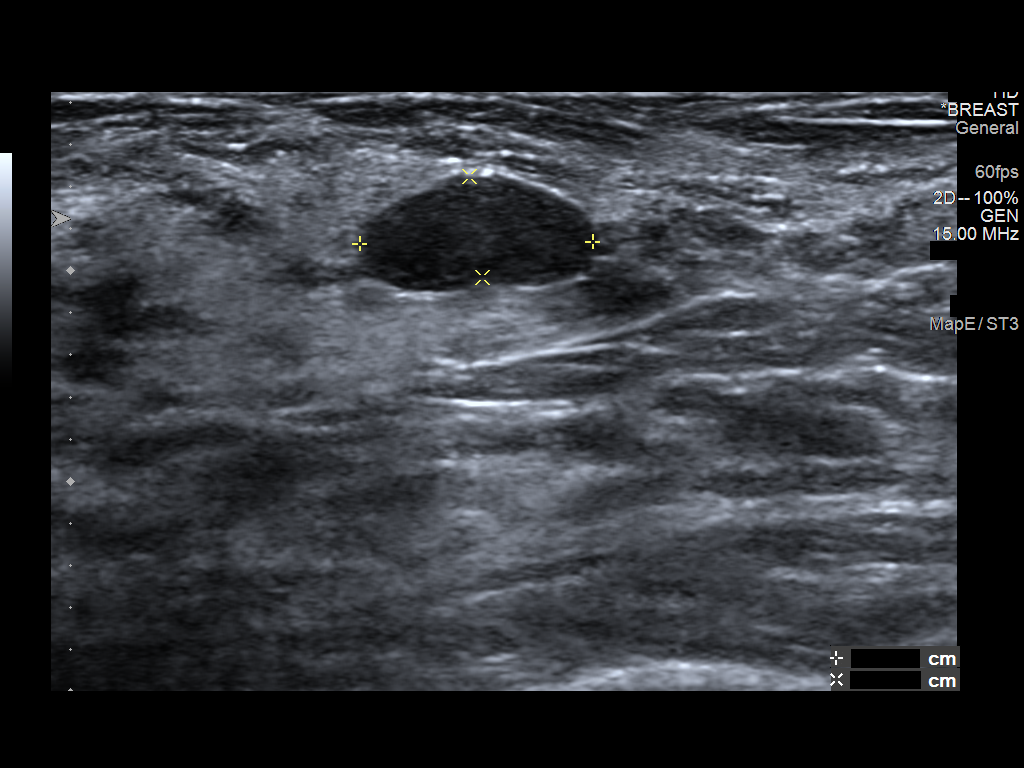
[im 3/4]
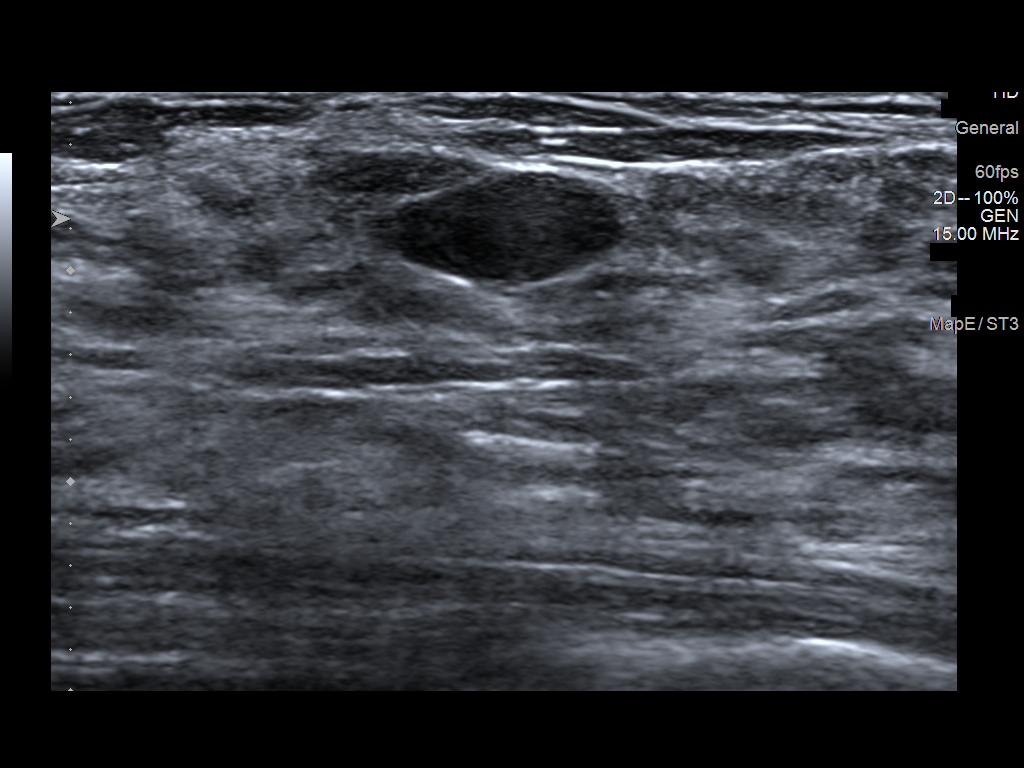
[im 4/4]
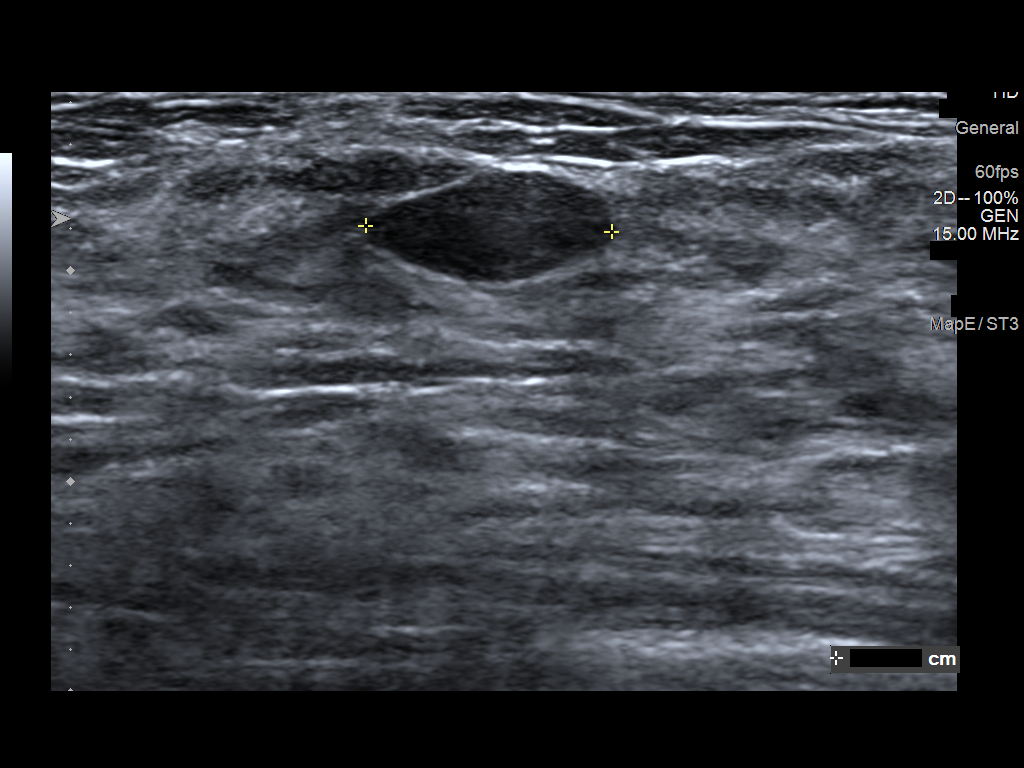

[4 of 4 positions shown; findings below may reference images not displayed]

ACR Breast Density Category d: The breast tissue is extremely dense,
which lowers the sensitivity of mammography.
FINDINGS: There are no new dominant masses, suspicious calcifications or
secondary signs of malignancy within either breast.

Targeted ultrasound is performed, again showing an oval
circumscribed hypoechoic mass in the RIGHT breast at the 9:30
o'clock axis, 5 cm from the nipple, measuring 1.2 cm, stable for 2
years confirming benignity.
IMPRESSION: 1. Stable benign mass within the RIGHT breast at the 9:30 o'clock
axis, measuring 1.2 cm, consistent with benign fibroadenoma. No
additional follow-up diagnostic imaging is necessary for this benign
finding.
2. No evidence of malignancy within either breast.

Patient may return to routine annual bilateral screening mammogram
schedule.

RECOMMENDATION:
Screening mammogram in one year.(Code:6R-6-5YX)

I have discussed the findings and recommendations with the patient.
If applicable, a reminder letter will be sent to the patient
regarding the next appointment.

BI-RADS CATEGORY  2: Benign.
